# Patient Record
Sex: Female | Born: 1965 | Race: White | Hispanic: No | Marital: Married | Smoking: Former smoker
Health system: Southern US, Community
[De-identification: ages and names within clinical notes are randomized; demographics above are authoritative.]

## PROBLEM LIST (undated history)

## (undated) DIAGNOSIS — C801 Malignant (primary) neoplasm, unspecified: Secondary | ICD-10-CM

## (undated) DIAGNOSIS — N289 Disorder of kidney and ureter, unspecified: Secondary | ICD-10-CM

## (undated) HISTORY — PX: BREAST SURGERY: SHX581

---

## 2004-06-19 ENCOUNTER — Ambulatory Visit: Payer: Self-pay | Admitting: Urology

## 2004-06-28 ENCOUNTER — Ambulatory Visit: Payer: Self-pay | Admitting: Otolaryngology

## 2005-08-11 ENCOUNTER — Ambulatory Visit: Payer: Self-pay

## 2006-03-26 ENCOUNTER — Ambulatory Visit: Payer: Self-pay | Admitting: Otolaryngology

## 2006-06-08 ENCOUNTER — Emergency Department: Payer: Self-pay | Admitting: Unknown Physician Specialty

## 2010-01-01 ENCOUNTER — Ambulatory Visit: Payer: Self-pay | Admitting: Family Medicine

## 2011-02-19 ENCOUNTER — Ambulatory Visit: Payer: Self-pay

## 2012-02-23 ENCOUNTER — Ambulatory Visit: Payer: Self-pay | Admitting: Family Medicine

## 2013-04-07 ENCOUNTER — Ambulatory Visit: Payer: Self-pay | Admitting: Emergency Medicine

## 2013-04-07 LAB — RAPID INFLUENZA A&B ANTIGENS

## 2013-08-17 ENCOUNTER — Ambulatory Visit: Payer: Self-pay | Admitting: Unknown Physician Specialty

## 2014-11-29 ENCOUNTER — Ambulatory Visit
Admission: RE | Admit: 2014-11-29 | Discharge: 2014-11-29 | Disposition: A | Discharge status: Home / Self Care | Payer: BC Managed Care – PPO | Source: Ambulatory Visit | Attending: Family Medicine | Admitting: Family Medicine

## 2014-11-29 ENCOUNTER — Other Ambulatory Visit: Payer: Self-pay | Admitting: Family Medicine

## 2014-11-29 DIAGNOSIS — M79604 Pain in right leg: Secondary | ICD-10-CM | POA: Diagnosis not present

## 2014-11-29 DIAGNOSIS — M79661 Pain in right lower leg: Secondary | ICD-10-CM

## 2014-11-29 DIAGNOSIS — M7989 Other specified soft tissue disorders: Principal | ICD-10-CM

## 2016-08-02 ENCOUNTER — Ambulatory Visit
Admission: EM | Admit: 2016-08-02 | Discharge: 2016-08-02 | Disposition: A | Discharge status: Home / Self Care | Payer: BC Managed Care – PPO

## 2016-08-02 ENCOUNTER — Encounter: Payer: Self-pay | Admitting: *Deleted

## 2016-08-02 DIAGNOSIS — R509 Fever, unspecified: Secondary | ICD-10-CM | POA: Diagnosis not present

## 2016-08-02 DIAGNOSIS — L03116 Cellulitis of left lower limb: Secondary | ICD-10-CM

## 2016-08-02 HISTORY — DX: Disorder of kidney and ureter, unspecified: N28.9

## 2016-08-02 HISTORY — DX: Malignant (primary) neoplasm, unspecified: C80.1

## 2016-08-02 MED ORDER — ONDANSETRON 8 MG PO TBDP
8.0000 mg | ORAL_TABLET | Freq: Once | ORAL | Status: AC
  Administered 2016-08-02: 8 mg via ORAL

## 2016-08-02 MED ORDER — ONDANSETRON HCL 8 MG PO TABS: 8.0000 mg | ORAL_TABLET | Freq: Three times a day (TID) | ORAL | 0 refills | Status: AC | PRN

## 2016-08-02 MED ORDER — CEFTRIAXONE SODIUM 1 G IJ SOLR
1.0000 g | Freq: Once | INTRAMUSCULAR | Status: AC
  Administered 2016-08-02: 1 g via INTRAMUSCULAR

## 2016-08-02 MED ORDER — SULFAMETHOXAZOLE-TRIMETHOPRIM 800-160 MG PO TABS: 1.0000 | ORAL_TABLET | Freq: Two times a day (BID) | ORAL | 0 refills | Status: AC

## 2016-08-02 NOTE — ED Triage Notes (Addendum)
Patient started having symptoms of skin irritation, redness, and swelling on her lower left leg yesterday. Patient has a history of recurrent cellulitis.

## 2016-08-02 NOTE — ED Provider Notes (Signed)
CSN: [id number]     Arrival date & time 08/02/16  0801 History   First MD Initiated Contact with Patient 08/02/16 [phone]     Chief Complaint  Patient presents with  . Recurrent Skin Infections   (Consider location/radiation/quality/duration/timing/severity/associated sxs/prior Treatment) 51 year old female accompanied by her husband with concern over redness, swelling of left lower leg. Started with a cut/abrasion on left heel about 1 week ago. Yesterday started feeling more fatigued and developed a fever of 101. Took Tylenol and soaked leg in bath tub with minimal relief. Unable to take NSAIDs due to GI upset/irritation. Today experiencing more redness of lower leg, swelling of feet and leg and increased pain. Also having mild nausea- no vomiting, shortness of breath, chest pain, or discharge from the heel. 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 Also has history of recurrent cellulitis in the left leg. Last episode about 2 years ago. Has been treated with Keflex, Bactrim and other antibiotics in the past with good success. Going out of town tomorrow and unable to see PCP. Requesting IM antibiotic today if possible.     The history is provided by the patient and the spouse.    Past Medical History:  Diagnosis Date  . Cancer ([place])   . Renal disorder    Past Surgical History:  Procedure Laterality Date  . BREAST SURGERY     History reviewed. No pertinent family history. Social History  Substance Use Topics  . Smoking status: Former Research scientist (life sciences)  . Smokeless tobacco: Never Used  . Alcohol use Yes   OB History    No data available     Review of Systems  Constitutional: Positive for appetite change, chills, fatigue and fever.  Respiratory: Negative for chest tightness, shortness of breath and wheezing.   Cardiovascular: Positive for leg swelling. Negative for chest pain.  Gastrointestinal: Positive for nausea. Negative for abdominal  pain, diarrhea and vomiting.  Genitourinary: Negative for difficulty urinating and dysuria.  Musculoskeletal: Positive for arthralgias, joint swelling and myalgias.  Skin: Positive for color change and wound. Negative for rash.  Allergic/Immunologic: Positive for immunocompromised state.  Neurological: Positive for headaches. Negative for dizziness, syncope, weakness and light-headedness.    Allergies  Patient has no known allergies.  Home Medications   Prior to Admission medications   Medication Sig Start Date End Date Taking? Authorizing Provider  citalopram (CELEXA) 40 MG tablet Take 40 mg by mouth daily.   Yes [provider]  Cyanocobalamin (VITAMIN B 12 PO) Take by mouth.   Yes [provider]  lamoTRIgine (LAMICTAL) 25 MG tablet Take 25 mg by mouth daily.   Yes [provider]  Multiple Vitamin (MULTIVITAMIN) tablet Take 1 tablet by mouth daily.   Yes [provider]  tamoxifen (NOLVADEX) 20 MG tablet Take 20 mg by mouth daily.   Yes [provider]  ondansetron (ZOFRAN) 8 MG tablet Take 1 tablet (8 mg total) by mouth every 8 (eight) hours as needed for nausea or vomiting. 08/02/16   Meztli Llanas, Nicholes Stairs, NP  sulfamethoxazole-trimethoprim (BACTRIM DS,SEPTRA DS) 800-160 MG tablet Take 1 tablet by mouth 2 (two) times daily. 08/02/16 08/12/16  Katy Apo, NP   Meds Ordered and Administered this Visit   Medications  cefTRIAXone (ROCEPHIN) injection 1 g (1 g Intramuscular Given 08/02/16 0859)  ondansetron (ZOFRAN-ODT) disintegrating tablet 8 mg (8 mg Oral Given 08/02/16 0859)    BP 131/73 (BP Location: Left Arm)  Pulse 75   Temp (!) 101.2 F (38.4 C)   Resp 20   Ht 5\' 7"  (1.702 m)   Wt 270 lb (122.5 kg)   SpO2 96%   BMI 42.29 kg/m  No data found.   Physical Exam  Constitutional: She is oriented to person, place, and time. She appears well-developed and well-nourished. She appears ill. No distress.  HENT:  Head: Normocephalic  and atraumatic.  Eyes: Conjunctivae and EOM are normal.  Neck: Normal range of motion. Neck supple.  Cardiovascular: Normal rate and regular rhythm.   Pulmonary/Chest: Effort normal and breath sounds normal. No respiratory distress.  Musculoskeletal: She exhibits edema and tenderness.       Left lower leg: She exhibits tenderness, swelling and edema.       Legs:      Feet:  Decreased range of motion of left leg due to pain and swelling. Swelling present from toes to heel up to popliteal area of knee. Redness and warmth present from ankle to mid-tibial area and tender. Small dime-sized abrasion present on left posterior plantar area of heel. Red but no discharge. Good distal pulses and capillary refill. No distinct neuro deficits noted.   Neurological: She is alert and oriented to person, place, and time. She has normal strength. No sensory deficit.  Skin: Skin is warm and dry. Capillary refill takes less than 2 seconds. Abrasion noted. There is erythema.  Psychiatric: She has a normal mood and affect. Her behavior is normal. Judgment and thought content normal.    Urgent Care Course     Procedures (including critical care time)  Labs Review Labs Reviewed - No data to display  Imaging Review No results found.   Visual Acuity Review  Right Eye Distance:   Left Eye Distance:   Bilateral Distance:    Right Eye Near:   Left Eye Near:    Bilateral Near:         MDM   1. Cellulitis of leg, left   2. Fever, unspecified    Discussed with patient and spouse that she probably has another case of cellulitis- probably from abrasion of heel. Gave Rocephin 1g IM today. Also gave Zofran 8mg  oral tablet now to help with nausea. Recommend start Bactrim 1 tablet twice a day as directed. May take Zofran 8mg  every 8 hours as needed for nausea. Continue to monitor area and soak area in warm water for comfort. May take Tylenol 1g every 8 hours as needed for pain and fever. [place] if not [place] or go to ER if symptoms worsen.     Katy Apo, NP 08/02/16 [phone]

## 2016-08-02 NOTE — Discharge Instructions (Signed)
You were given a shot of antibiotic (Rocephin) to help treat the infection in your leg. Recommend start Bactrim 1 tablet twice a day as directed. May take Zofran 8mg  every 8 hours as needed for nausea. Continue to monitor area. Take Tylenol 1g every 8 hours as needed for pain and fever. Follow-up in 2 to 3 days if not improving.

## 2017-06-15 ENCOUNTER — Other Ambulatory Visit: Payer: Self-pay | Admitting: Family Medicine

## 2017-06-15 DIAGNOSIS — Z780 Asymptomatic menopausal state: Secondary | ICD-10-CM

## 2017-08-26 IMAGING — US US EXTREM LOW VENOUS*R*
1 series · 13 of 24 positions shown · non-contrast
Comparison: None.

CLINICAL DATA: Right lower extremity pain and swelling for 2 days.
History of breast cancer on tamoxifen.



[Series 1: us extrem low venous*right* · 0.09mm/px · 13 of 35 slices shown]
[im 1/35]
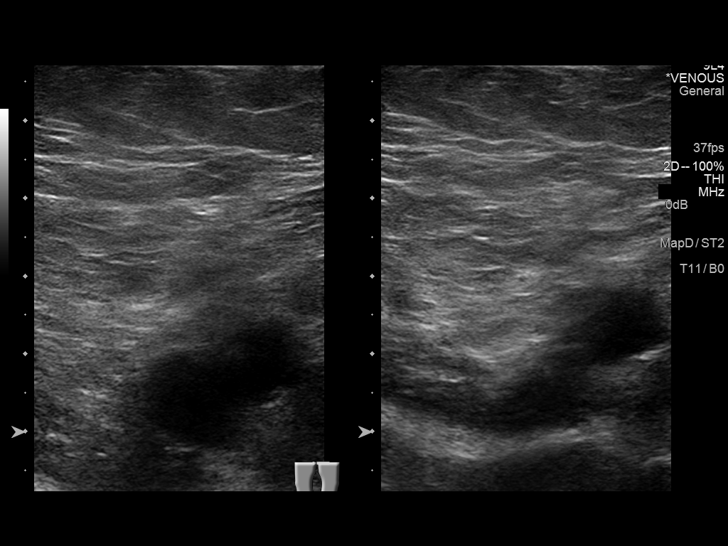
[im 3/35]
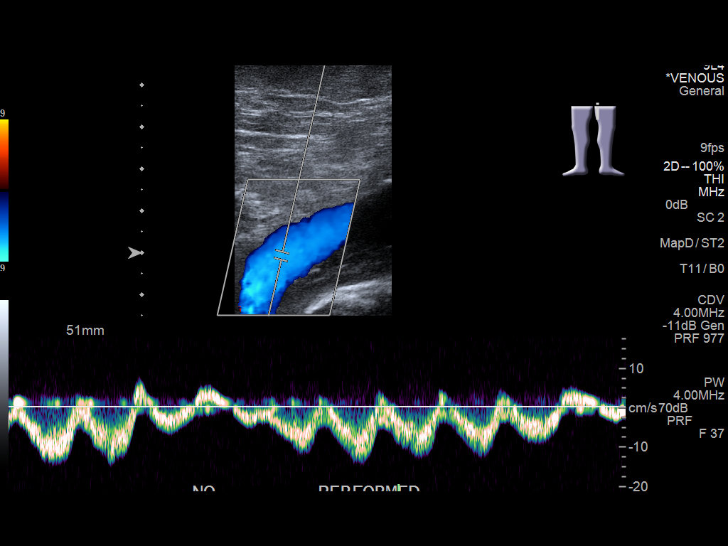
[im 6/35]
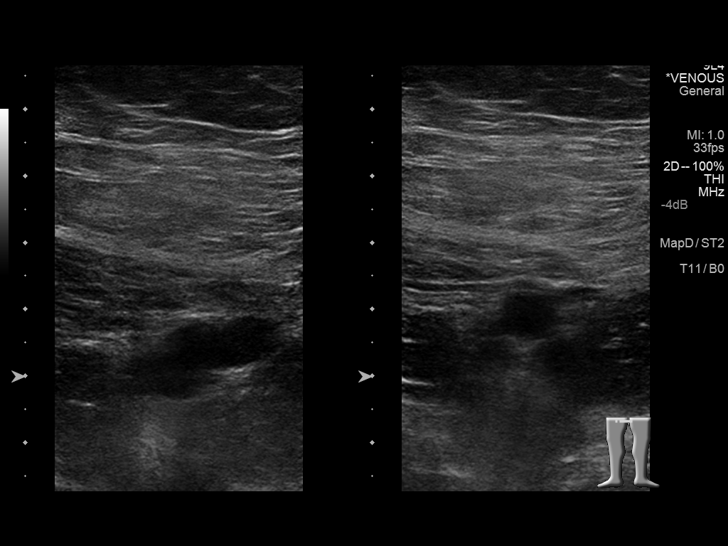
[im 9/35]
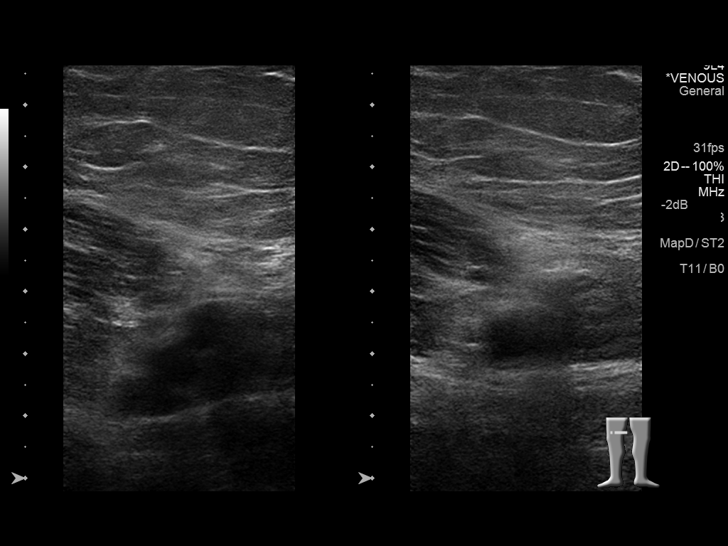
[im 12/35]
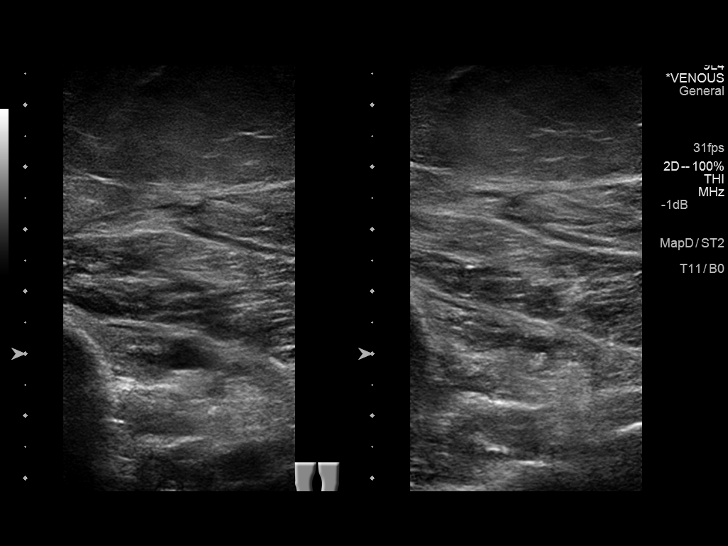
[im 15/35]
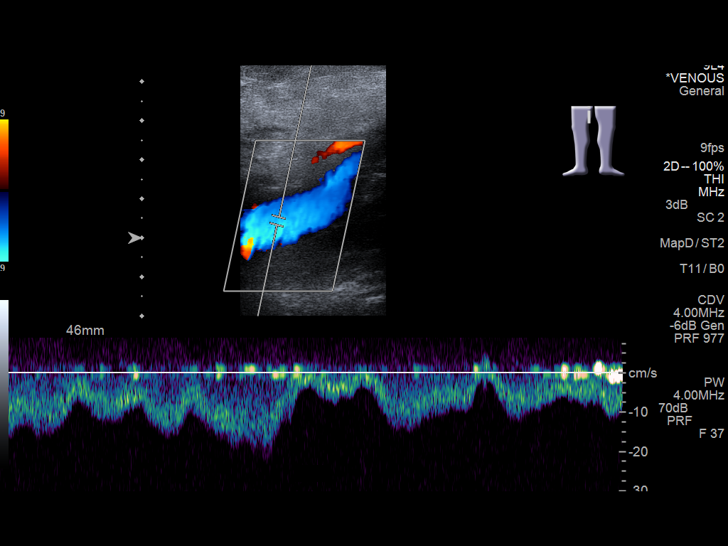
[im 18/35]
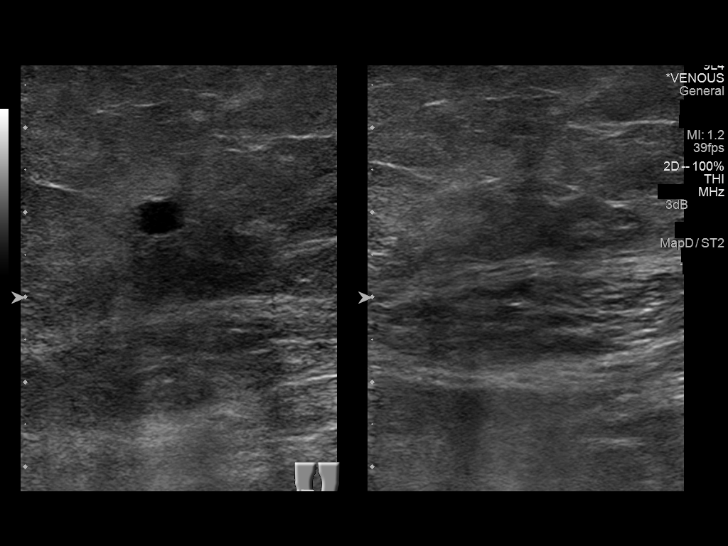
[im 20/35]
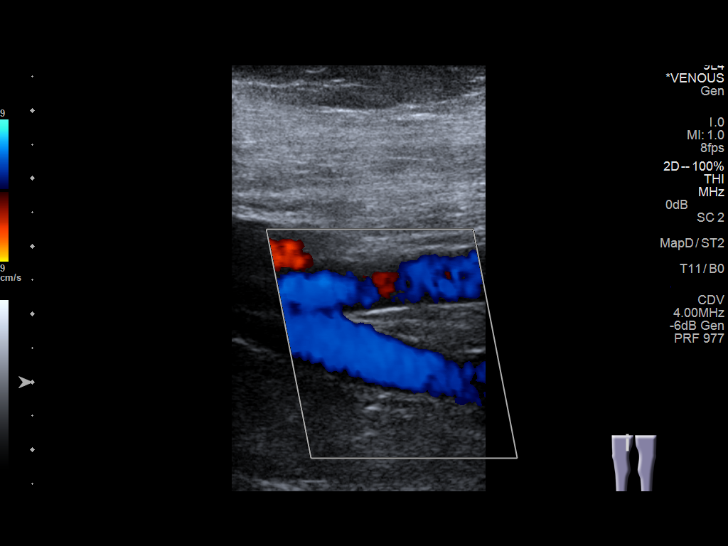
[im 23/35]
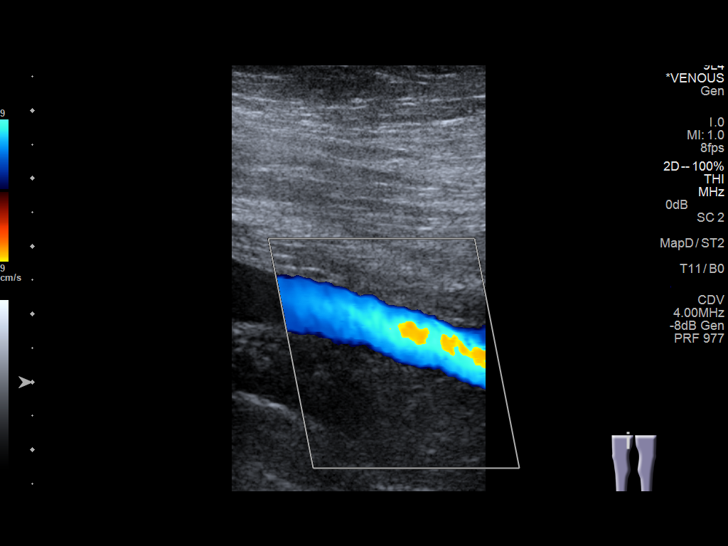
[im 26/35]
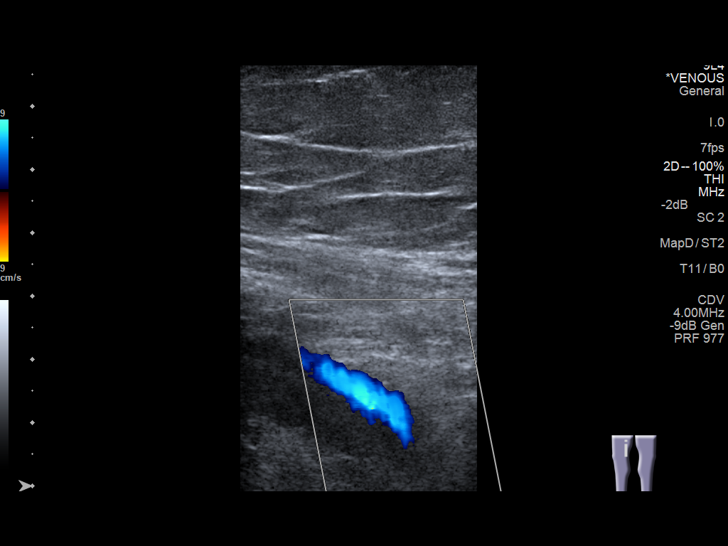
[im 29/35]
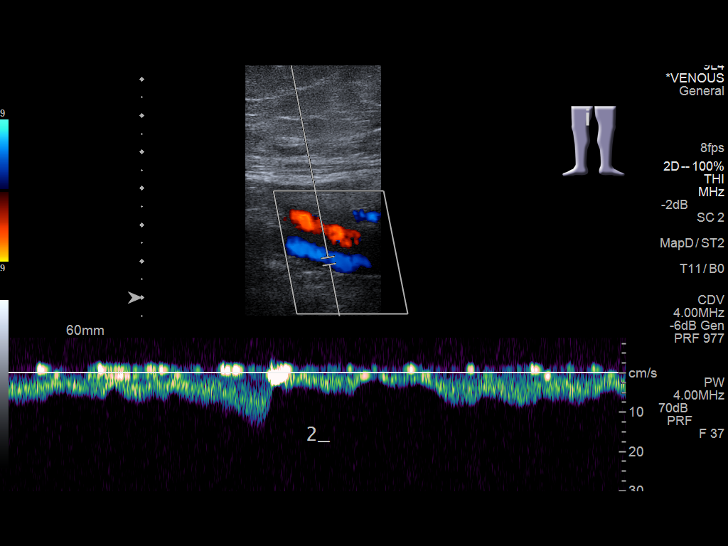
[im 32/35]
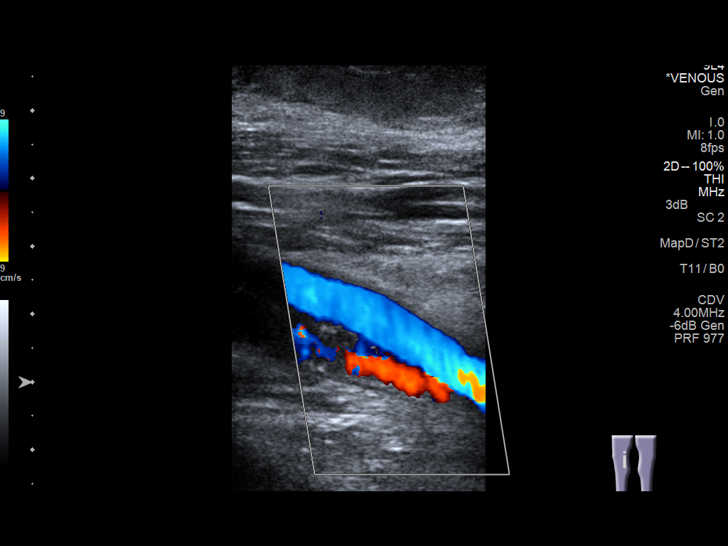
[im 35/35]
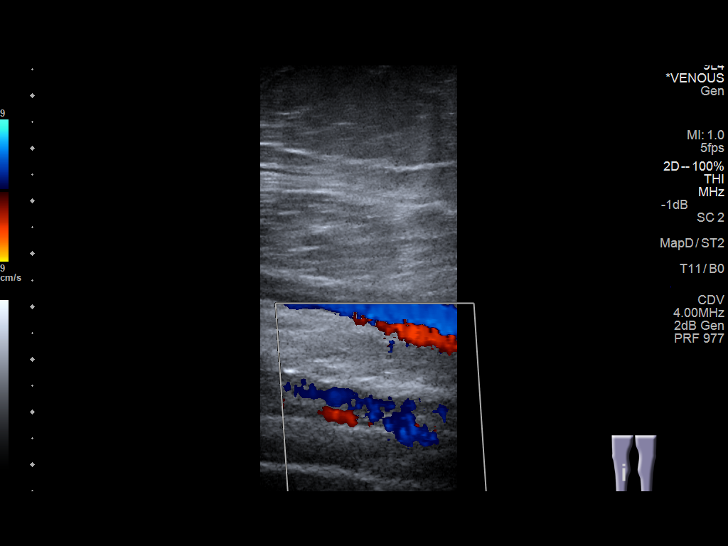

[13 of 24 positions shown; findings below may reference images not displayed]

FINDINGS: Contralateral Common Femoral Vein: Respiratory phasicity is normal
and symmetric with the symptomatic side. No evidence of thrombus.
Normal compressibility.

Common Femoral Vein: No evidence of thrombus. Normal
compressibility, respiratory phasicity and response to augmentation.

Saphenofemoral Junction: No evidence of thrombus. Normal
compressibility and flow on color Doppler imaging.

Profunda Femoral Vein: No evidence of thrombus. Normal
compressibility and flow on color Doppler imaging.

Femoral Vein: No evidence of thrombus. Normal compressibility,
respiratory phasicity and response to augmentation.

Popliteal Vein: No evidence of thrombus. Normal compressibility,
respiratory phasicity and response to augmentation.

Calf Veins: No evidence of thrombus. Normal compressibility and flow
on color Doppler imaging.

Superficial Great Saphenous Vein: No evidence of thrombus. Normal
compressibility and flow on color Doppler imaging.

Venous Reflux:  None.

Other Findings:  None.
IMPRESSION: No evidence of deep venous thrombosis.

## 2017-10-15 ENCOUNTER — Other Ambulatory Visit: Payer: Self-pay | Admitting: Family Medicine

## 2017-10-15 ENCOUNTER — Ambulatory Visit
Admission: RE | Admit: 2017-10-15 | Discharge: 2017-10-15 | Disposition: A | Discharge status: Home / Self Care | Payer: BC Managed Care – PPO | Source: Ambulatory Visit | Attending: Family Medicine | Admitting: Family Medicine

## 2017-10-15 DIAGNOSIS — R05 Cough: Secondary | ICD-10-CM | POA: Insufficient documentation

## 2017-10-15 DIAGNOSIS — I700 Atherosclerosis of aorta: Secondary | ICD-10-CM | POA: Insufficient documentation

## 2017-10-15 DIAGNOSIS — R059 Cough, unspecified: Secondary | ICD-10-CM

## 2017-10-26 ENCOUNTER — Telehealth (HOSPITAL_COMMUNITY): Payer: Self-pay

## 2017-10-26 NOTE — Telephone Encounter (Signed)
Per natalie burky x ray is normal pt should follow up with PCP. Attempted to reach patient. No answer at this time.

## 2017-11-30 ENCOUNTER — Ambulatory Visit: Payer: BC Managed Care – PPO | Admitting: Dietician

## 2017-12-03 ENCOUNTER — Encounter: Payer: Self-pay | Admitting: Dietician

## 2018-03-03 ENCOUNTER — Other Ambulatory Visit: Payer: Self-pay | Admitting: Family Medicine

## 2018-03-03 ENCOUNTER — Ambulatory Visit
Admission: RE | Admit: 2018-03-03 | Discharge: 2018-03-03 | Disposition: A | Discharge status: Home / Self Care | Payer: BC Managed Care – PPO | Source: Ambulatory Visit | Attending: Family Medicine | Admitting: Family Medicine

## 2018-03-03 DIAGNOSIS — R1032 Left lower quadrant pain: Secondary | ICD-10-CM | POA: Diagnosis not present

## 2018-03-03 LAB — POCT I-STAT CREATININE: Creatinine, Ser: 0.8 mg/dL (ref 0.44–1.00)

## 2018-03-03 MED ORDER — IOHEXOL 300 MG/ML  SOLN
100.0000 mL | Freq: Once | INTRAMUSCULAR | Status: AC | PRN
  Administered 2018-03-03: 100 mL via INTRAVENOUS

## 2018-08-10 ENCOUNTER — Other Ambulatory Visit: Payer: Self-pay | Admitting: Family Medicine

## 2018-08-10 DIAGNOSIS — Z780 Asymptomatic menopausal state: Secondary | ICD-10-CM

## 2019-04-10 ENCOUNTER — Ambulatory Visit: Payer: BC Managed Care – PPO | Attending: Internal Medicine

## 2019-04-10 DIAGNOSIS — Z23 Encounter for immunization: Secondary | ICD-10-CM | POA: Insufficient documentation

## 2019-04-10 NOTE — Progress Notes (Signed)
   Covid-19 Vaccination Clinic  Name:  Emily Noble    MRN: [id number] DOB: April 15, 1965  04/10/2019  [name] was observed post Covid-19 immunization for 15 minutes without incidence. She was provided with Vaccine Information Sheet and instruction to access the V-Safe system.   [name] was instructed to call 911 with any severe reactions post vaccine: Marland Kitchen Difficulty breathing  . Swelling of your face and throat  . A fast heartbeat  . A bad rash all over your body  . Dizziness and weakness    Immunizations Administered    Name Date Dose VIS Date Route   Pfizer COVID-19 Vaccine 04/10/2019 10:46 AM 0.3 mL 01/21/2019 Intramuscular   Manufacturer: [place]   Lot: [id number]   [place]: [id number]

## 2019-05-03 ENCOUNTER — Ambulatory Visit: Payer: BC Managed Care – PPO | Attending: Internal Medicine

## 2019-05-03 DIAGNOSIS — Z23 Encounter for immunization: Secondary | ICD-10-CM

## 2019-05-03 NOTE — Progress Notes (Signed)
   Covid-19 Vaccination Clinic  Name:  Emily Noble    MRN: [id number] DOB: Apr 05, 1965  05/03/2019  [name] was observed post Covid-19 immunization for 15 minutes without incident. She was provided with Vaccine Information Sheet and instruction to access the V-Safe system.   [name] was instructed to call 911 with any severe reactions post vaccine: Marland Kitchen Difficulty breathing  . Swelling of face and throat  . A fast heartbeat  . A bad rash all over body  . Dizziness and weakness   Immunizations Administered    Name Date Dose VIS Date Route   Pfizer COVID-19 Vaccine 05/03/2019  3:42 PM 0.3 mL 01/21/2019 Intramuscular   Manufacturer: [place]   Lot: [id number]   [place]: [id number]

## 2020-04-02 IMAGING — CR DG CHEST 2V
3 series · 3 of 3 positions shown · non-contrast
Comparison: PA and lateral chest x-ray dated January 01, 2010

CLINICAL DATA: cough x 3 weeks or longer.

EXAM:
CHEST - 2 VIEW

[chest pa (1 of 2)]
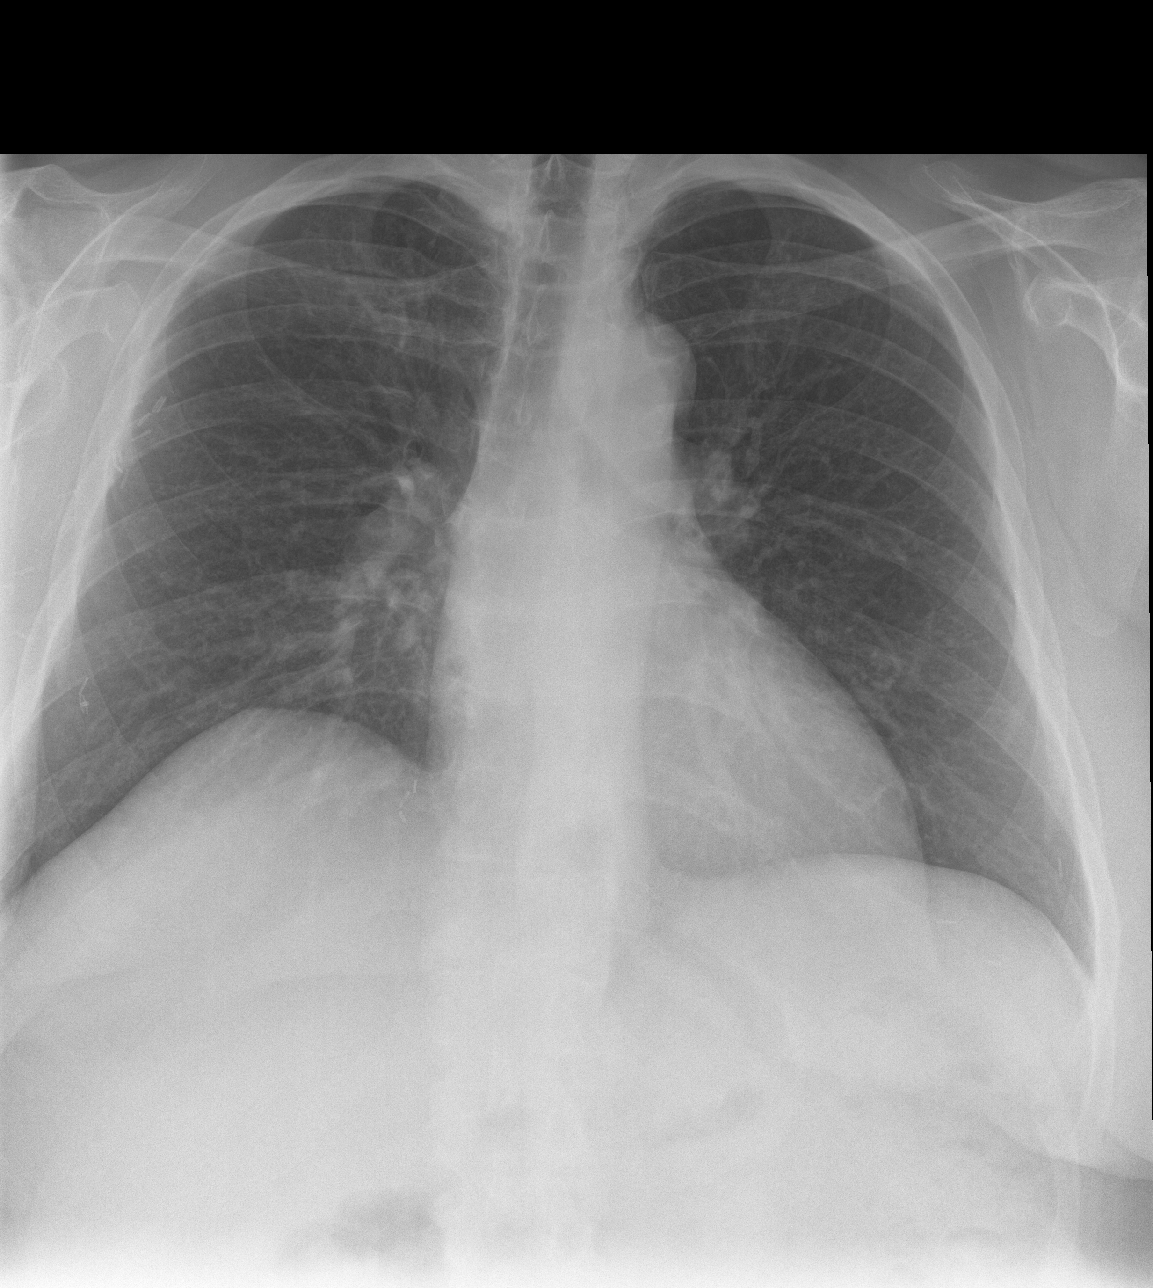

[chest lat]
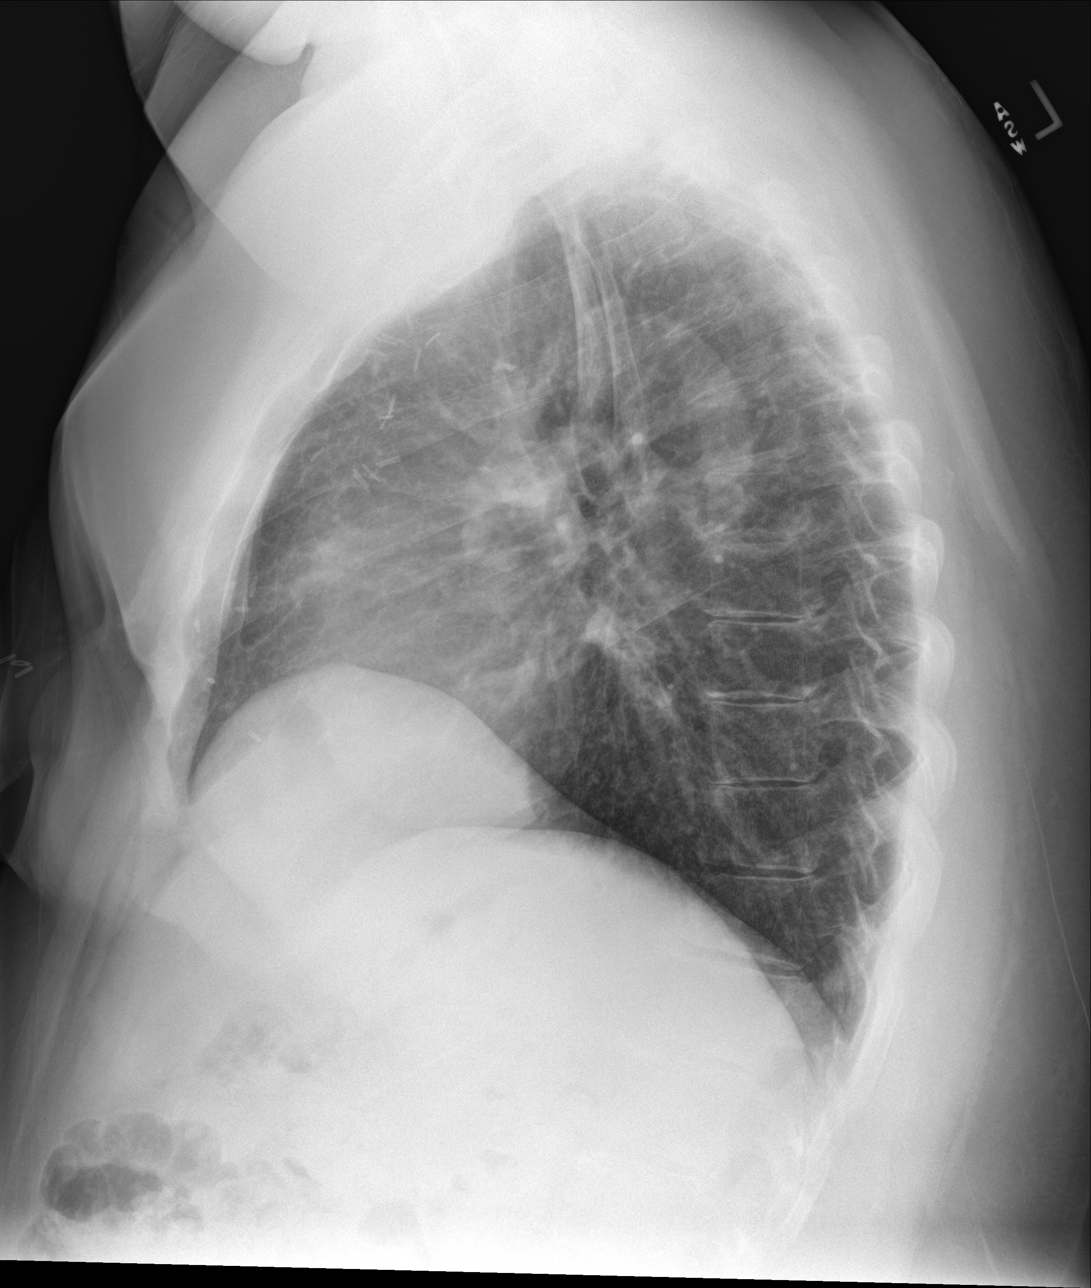

[chest pa (2 of 2)]
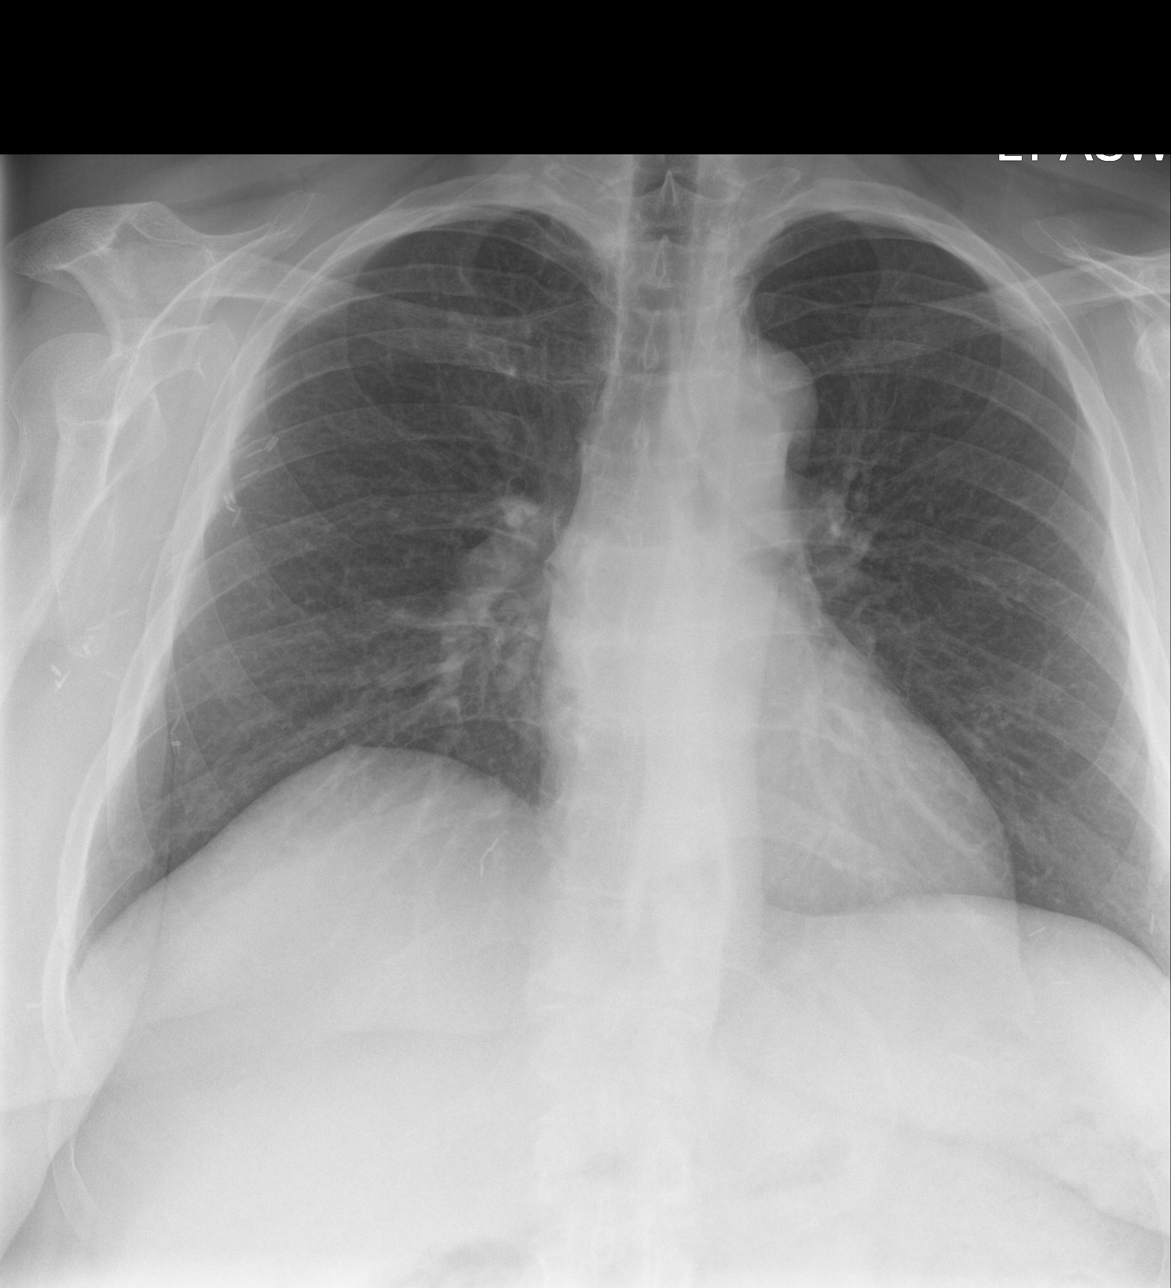

[3 of 3 positions shown; findings below may reference images not displayed]

FINDINGS: The lungs are adequately inflated and clear. The heart and pulmonary
vascularity are normal. The mediastinum is normal in width. There is
no pleural effusion. The bony thorax exhibits no acute abnormality.
There surgical clips in the left axillary region likely from lymph
node dissection.
IMPRESSION: There is no pneumonia, CHF, nor other acute cardiopulmonary
abnormality.

Thoracic aortic atherosclerosis.

## 2023-04-11 DEATH — deceased
